# Patient Record
Sex: Female | Born: 1965 | Race: White | Hispanic: No | Marital: Single | Smoking: Current every day smoker
Health system: Southern US, Community
[De-identification: ages and names within clinical notes are randomized; demographics above are authoritative.]

## PROBLEM LIST (undated history)

## (undated) HISTORY — PX: TUBAL LIGATION: SHX77

---

## 2017-07-16 ENCOUNTER — Emergency Department (HOSPITAL_BASED_OUTPATIENT_CLINIC_OR_DEPARTMENT_OTHER): Payer: Self-pay

## 2017-07-16 ENCOUNTER — Other Ambulatory Visit: Payer: Self-pay

## 2017-07-16 ENCOUNTER — Encounter (HOSPITAL_BASED_OUTPATIENT_CLINIC_OR_DEPARTMENT_OTHER): Payer: Self-pay | Admitting: *Deleted

## 2017-07-16 ENCOUNTER — Emergency Department (HOSPITAL_BASED_OUTPATIENT_CLINIC_OR_DEPARTMENT_OTHER)
Admission: EM | Admit: 2017-07-16 | Discharge: 2017-07-16 | Disposition: A | Discharge status: Home / Self Care | Payer: Self-pay | Attending: Emergency Medicine | Admitting: Emergency Medicine

## 2017-07-16 DIAGNOSIS — F17200 Nicotine dependence, unspecified, uncomplicated: Secondary | ICD-10-CM | POA: Insufficient documentation

## 2017-07-16 DIAGNOSIS — J189 Pneumonia, unspecified organism: Secondary | ICD-10-CM | POA: Insufficient documentation

## 2017-07-16 LAB — CBC WITH DIFFERENTIAL/PLATELET
Basophils Absolute: 0 10*3/uL (ref 0.0–0.1)
Basophils Relative: 0 %
Eosinophils Absolute: 0.6 10*3/uL (ref 0.0–0.7)
Eosinophils Relative: 5 %
HCT: 37.4 % (ref 36.0–46.0)
Hemoglobin: 11.9 g/dL — ABNORMAL LOW (ref 12.0–15.0)
Lymphocytes Relative: 28 %
Lymphs Abs: 3.2 10*3/uL (ref 0.7–4.0)
MCH: 27.4 pg (ref 26.0–34.0)
MCHC: 31.8 g/dL (ref 30.0–36.0)
MCV: 86 fL (ref 78.0–100.0)
Monocytes Absolute: 0.8 10*3/uL (ref 0.1–1.0)
Monocytes Relative: 7 %
Neutro Abs: 6.7 10*3/uL (ref 1.7–7.7)
Neutrophils Relative %: 60 %
Platelets: 410 10*3/uL — ABNORMAL HIGH (ref 150–400)
RBC: 4.35 MIL/uL (ref 3.87–5.11)
RDW: 14 % (ref 11.5–15.5)
WBC: 11.3 10*3/uL — ABNORMAL HIGH (ref 4.0–10.5)

## 2017-07-16 LAB — BASIC METABOLIC PANEL
Anion gap: 11 (ref 5–15)
BUN: 17 mg/dL (ref 6–20)
CO2: 28 mmol/L (ref 22–32)
Calcium: 8.9 mg/dL (ref 8.9–10.3)
Chloride: 97 mmol/L — ABNORMAL LOW (ref 101–111)
Creatinine, Ser: 0.69 mg/dL (ref 0.44–1.00)
GFR calc Af Amer: >60 mL/min (ref >60)
GFR calc non Af Amer: >60 mL/min (ref >60)
Glucose, Bld: 102 mg/dL — ABNORMAL HIGH (ref 65–99)
Potassium: 3.3 mmol/L — ABNORMAL LOW (ref 3.5–5.1)
Sodium: 136 mmol/L (ref 135–145)

## 2017-07-16 LAB — TROPONIN I: Troponin I: <0.03 ng/mL (ref <0.03)

## 2017-07-16 MED ORDER — ONDANSETRON HCL 4 MG/2ML IJ SOLN
4.0000 mg | Freq: Once | INTRAMUSCULAR | Status: AC
  Administered 2017-07-16: 4 mg via INTRAVENOUS
  Filled 2017-07-16: qty 2

## 2017-07-16 MED ORDER — POTASSIUM CHLORIDE CRYS ER 20 MEQ PO TBCR
40.0000 meq | EXTENDED_RELEASE_TABLET | Freq: Once | ORAL | Status: AC
  Administered 2017-07-16: 40 meq via ORAL
  Filled 2017-07-16: qty 2

## 2017-07-16 MED ORDER — IPRATROPIUM-ALBUTEROL 0.5-2.5 (3) MG/3ML IN SOLN
3.0000 mL | Freq: Once | RESPIRATORY_TRACT | Status: AC
  Administered 2017-07-16: 3 mL via RESPIRATORY_TRACT
  Filled 2017-07-16: qty 3

## 2017-07-16 MED ORDER — AMOXICILLIN-POT CLAVULANATE 875-125 MG PO TABS: 1.0000 | ORAL_TABLET | Freq: Two times a day (BID) | ORAL | 0 refills | Status: AC

## 2017-07-16 MED ORDER — AMOXICILLIN-POT CLAVULANATE 875-125 MG PO TABS
1.0000 | ORAL_TABLET | Freq: Once | ORAL | Status: AC
  Administered 2017-07-16: 1 via ORAL
  Filled 2017-07-16: qty 1

## 2017-07-16 MED ORDER — AZITHROMYCIN 250 MG PO TABS: 250.0000 mg | ORAL_TABLET | Freq: Every day | ORAL | 0 refills | Status: AC

## 2017-07-16 NOTE — ED Notes (Signed)
Ambulate pt on pulse Ox, pt ranged 92%-96%

## 2017-07-16 NOTE — ED Triage Notes (Signed)
Cough x 3 weeks. Worse at night. She feels like she can't breathe when she starts coughing and can't stop.

## 2017-07-16 NOTE — Discharge Instructions (Signed)
Please take all of your antibiotics until finished!   You may develop abdominal discomfort or diarrhea from the antibiotic.  You may help offset this with probiotics which you can buy or get in yogurt. Do not eat  or take the probiotics until 2 hours after your antibiotic.   Drink plenty of fluids and get plenty of rest.  Alternate 600 mg of ibuprofen and [phone] mg of Tylenol every 3 hours as needed for pain. Do not exceed 4000 mg of Tylenol daily.  Follow-up with a primary care physician for reevaluation of your symptoms.  You may call [facility] tomorrow and tell them that you were referred from the emergency department to establish care with them.  Return to the emergency department if any concerning signs or symptoms develop such as persisting fever that does not improve with ibuprofen or Tylenol, worsening shortness of breath or chest pain, or coughing up blood.

## 2017-07-16 NOTE — ED Provider Notes (Signed)
[facility] EMERGENCY DEPARTMENT Provider Note   CSN: [id number] Arrival date & time: 07/16/17  1755     History   Chief Complaint Chief Complaint  Patient presents with  . Cough    HPI Natalie Owens is a 52 y.o. female presents today with chief complaint acute onset, progressively worsening shortness of breath and cough for 3 weeks.  She states that cough has been present since Christmas.  Productive of yellow-green sputum.  She endorses progressively worsening shortness of breath, DOE, and orthopnea.  She endorses subjective fevers and chills.  She notes that her throat feels sore and that it is difficult to eat secondary to pain with swallowing and decreased appetite.  She states that she stopped smoking on Christmas due to her symptoms and has not had a cigarette in 3 weeks, but prior to this was smoking nearly 1 pack of cigarettes daily.  Endorses intermittent chest pain, most recently experienced it on Sunday 3 days ago.  Pain was substernal with radiation to the right side and resolved after a few hours.  She states that it helped to put a pillow on her chest.  She cannot describe the pain to me.  She has not been seen by a primary care physician in several years and is unsure what medical problems she may have. She does endorse known sick contacts as her coworkers had similar symptoms.  Denies nausea, vomiting, abdominal pain, or diaphoresis.  She has tried over-the-counter cold medications and Mucinex as well as Delsym without significant relief of her symptoms.  The history is provided by the patient.    History reviewed. No pertinent past medical history.  There are no active problems to display for this patient.   Past Surgical History:  Procedure Laterality Date  . TUBAL LIGATION      OB History    No data available       Home Medications    Prior to Admission medications   Medication Sig Start Date End Date Taking? Authorizing Provider    amoxicillin-clavulanate (AUGMENTIN) 875-125 MG tablet Take 1 tablet by mouth every 12 (twelve) hours for 10 days. 07/16/17 07/26/17  Michela Pitcher A, PA-C  azithromycin (ZITHROMAX) 250 MG tablet Take 1 tablet (250 mg total) by mouth daily. Take first 2 tablets together, then 1 every day until finished. 07/16/17   Jeanie Sewer, PA-C    Family History No family history on file.  Social History Social History   Tobacco Use  . Smoking status: Current Every Day Smoker  . Smokeless tobacco: Never Used  Substance Use Topics  . Alcohol use: No    Frequency: Never  . Drug use: No     Allergies   Patient has no allergy information on record.   Review of Systems Review of Systems  Constitutional: Positive for chills and fever.  HENT: Positive for sore throat.   Respiratory: Positive for cough and shortness of breath.   Cardiovascular: Positive for chest pain. Negative for palpitations and leg swelling.  Gastrointestinal: Negative for abdominal pain, nausea and vomiting.  All other systems reviewed and are negative.    Physical Exam Updated Vital Signs BP 120/84   Pulse 88   Temp 98.1 F (36.7 C) (Oral)   Resp 20   Ht 5\' 3"  (1.6 m)   SpO2 92%   Physical Exam  Constitutional: She appears well-developed and well-nourished. No distress.  HENT:  Head: Normocephalic and atraumatic.  Right Ear: External ear normal.  Left  Ear: External ear normal.  Mouth/Throat: Oropharynx is clear and moist.  Posterior oropharynx with mild erythema but no tonsillar hypertrophy, uvular deviation, exudates, or trismus.  No frontal or maxillary sinus tenderness.  Nasal septum is midline without mucosal edema.  TMs without erythema or bulging bilaterally  Eyes: Conjunctivae and EOM are normal. Pupils are equal, round, and reactive to light. Right eye exhibits no discharge. Left eye exhibits no discharge.  Neck: Normal range of motion. Neck supple. No JVD present. No tracheal deviation present.   Cardiovascular: Regular rhythm, normal heart sounds and intact distal pulses.  Mildly tachycardic, 2+ radial and DP/PT pulses bl, negative Homan's bl, no LE edema  Pulmonary/Chest: Effort normal. She has rales. She exhibits no tenderness.  Hypoactive breath sounds globally with Rales noted on the right side anteriorly and posteriorly.  Equal rise and fall of chest, speaking in full sentences without difficulty, audible wheezing while speaking to me.  Abdominal: Soft. Bowel sounds are normal. She exhibits no distension. There is no tenderness.  Musculoskeletal: She exhibits no edema.  Neurological: She is alert.  Skin: Skin is warm and dry. No erythema.  Psychiatric: She has a normal mood and affect. Her behavior is normal.  Nursing note and vitals reviewed.    ED Treatments / Results  Labs (all labs ordered are listed, but only abnormal results are displayed) Labs Reviewed  BASIC METABOLIC PANEL - Abnormal; Notable for the following components:      Result Value   Potassium 3.3 (*)    Chloride 97 (*)    Glucose, Bld 102 (*)    All other components within normal limits  CBC WITH DIFFERENTIAL/PLATELET - Abnormal; Notable for the following components:   WBC 11.3 (*)    Hemoglobin 11.9 (*)    Platelets 410 (*)    All other components within normal limits  TROPONIN I    EKG  EKG Interpretation  Date/Time:  Tuesday July 16 2017 20:10:29 EST Ventricular Rate:  83 PR Interval:    QRS Duration: 96 QT Interval:  384 QTC Calculation: 452 R Axis:   76 Text Interpretation:  Sinus rhythm Confirmed by Rolland PorterJames, Mark ([id number]) on 07/16/2017 8:13:36 PM Also confirmed by Rolland PorterJames, Mark ([id number]), editor Barbette Hairassel, Kerry [phone]  on 07/17/2017 7:46:36 AM       Radiology Dg Chest 2 View  Result Date: 07/16/2017 CLINICAL DATA:  Cough and chest congestion x 3 weeks. Rt side pain sudden onset x 2 days ago. HX: Smoker EXAM: CHEST  2 VIEW COMPARISON:  None. FINDINGS: There is consolidation on the  right. On the frontal view, this extends inferior to the right hilum. On the lateral view this projects posterior to the hilum and extends into the right lower lobe and to the posterior right middle lobe. Left lung is clear. Lungs are mildly hyperexpanded. No pleural effusion.  No pneumothorax. Cardiac silhouette is normal in size. No mediastinal masses. No convincing hilar masses. Skeletal structures are unremarkable. IMPRESSION: 1. Patchy consolidation in the right lower and middle lobes consistent with multifocal pneumonia. Electronically Signed   By: Amie Portlandavid  Ormond M.D.   On: 07/16/2017 18:24    Procedures Procedures (including critical care time)  Medications Ordered in ED Medications  ipratropium-albuterol (DUONEB) 0.5-2.5 (3) MG/3ML nebulizer solution 3 mL (3 mLs Nebulization Given 07/16/17 2041)  amoxicillin-clavulanate (AUGMENTIN) 875-125 MG per tablet 1 tablet (1 tablet Oral Given 07/16/17 2155)  potassium chloride SA (K-DUR,KLOR-CON) CR tablet 40 mEq (40 mEq Oral Given 07/16/17 2155)  ondansetron (  ZOFRAN) injection 4 mg (4 mg Intravenous Given 07/16/17 2150)     Initial Impression / Assessment and Plan / ED Course  I have reviewed the triage vital signs and the nursing notes.  Pertinent labs & imaging results that were available during my care of the patient were reviewed by me and considered in my medical decision making (see chart for details).     Patient with productive cough for 3 weeks.  Afebrile, SPO2 saturations between 90-95% on room air.  Patient speaking in full sentences without difficulty.  She complains of a mild sore throat additionally but presentation is not concerning for strep pharyngitis or PTA.  No evidence of airway compromise and she is tolerating secretions well.  Doubt PE in this patient.  Troponin is negative and EKG shows normal sinus rhythm, doubt ACS or MI.  Chest x-ray shows multifocal pneumonia.  Remainder of lab work is reassuring.  She was ambulated with  pulse ox with stable oxygen saturations.  She improved symptomatically after administration of breathing treatment and on re-auscultation of the lungs airway movement has improved.  She is nontoxic in appearance.  CURB-65 score is 0 and PSI/port score places patient in the low risk category recommending outpatient treatment of community-acquired pneumonia.  No evidence of pericarditis, myocarditis, aortic dissection, or bronchitis.  Will discharge with antibiotics.  Recommend follow-up with a primary care physician for reevaluation of her symptoms.  Encouraged patient to continue with her smoking cessation.  Discussed indications for return to the ED.  Patient and patient's daughter verbalized understanding of and agreement with plan and patient is stable for discharge home at this time.  Final Clinical Impressions(s) / ED Diagnoses   Final diagnoses:  Multifocal pneumonia    ED Discharge Orders        Ordered    amoxicillin-clavulanate (AUGMENTIN) 875-125 MG tablet  Every 12 hours     07/16/17 2151    azithromycin (ZITHROMAX) 250 MG tablet  Daily     07/16/17 2151       Bennye Alm 07/17/17 2303    Rolland Porter, MD 07/20/17 1649

## 2017-07-16 NOTE — ED Notes (Signed)
C/o cough congestion x 3 weeks w cp at times

## 2017-08-06 ENCOUNTER — Inpatient Hospital Stay: Payer: Self-pay

## 2018-05-25 IMAGING — CR DG CHEST 2V
2 series · 2 of 2 positions shown · non-contrast
Comparison: None.

CLINICAL DATA: Cough and chest congestion x 3 weeks. Rt side pain
sudden onset x 2 days ago. HX: Smoker

EXAM:
CHEST  2 VIEW

[w chest pa]
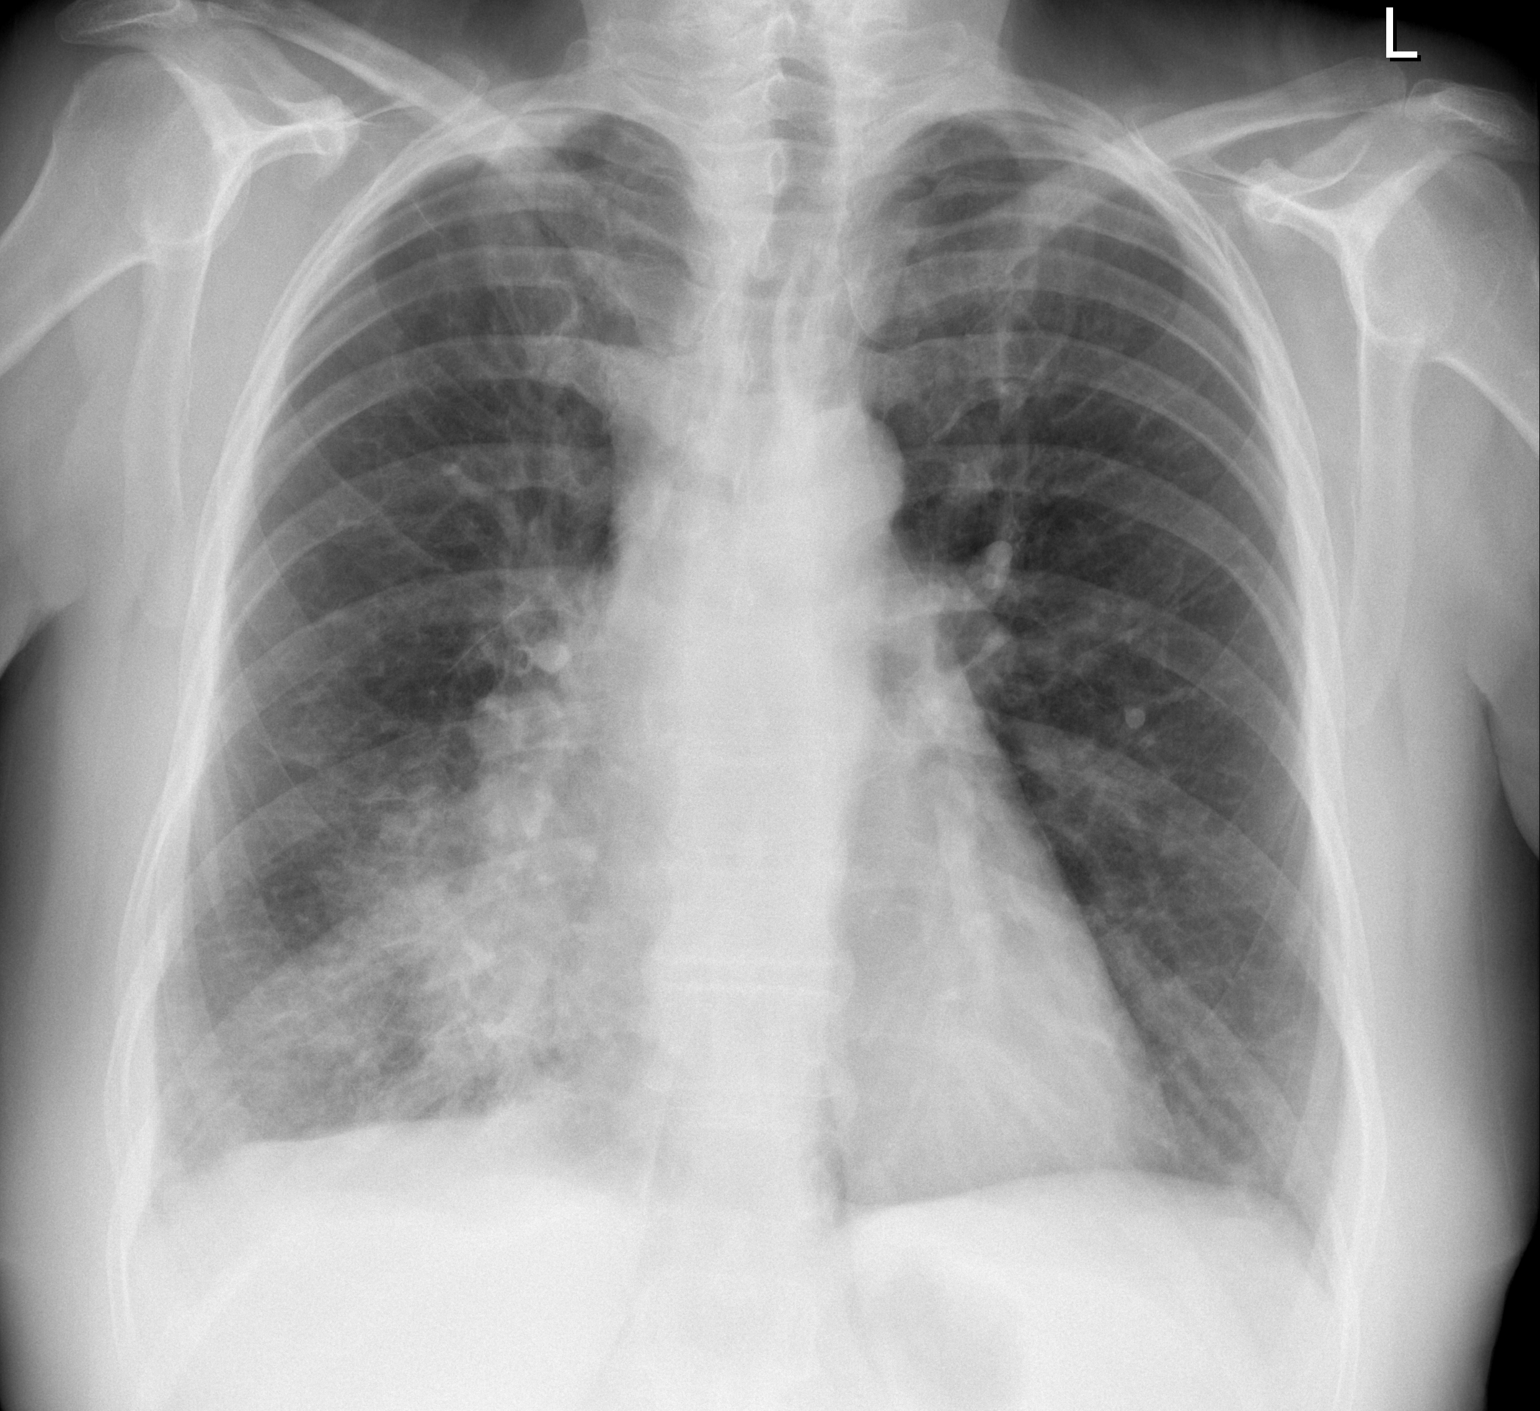

[w chest lat]
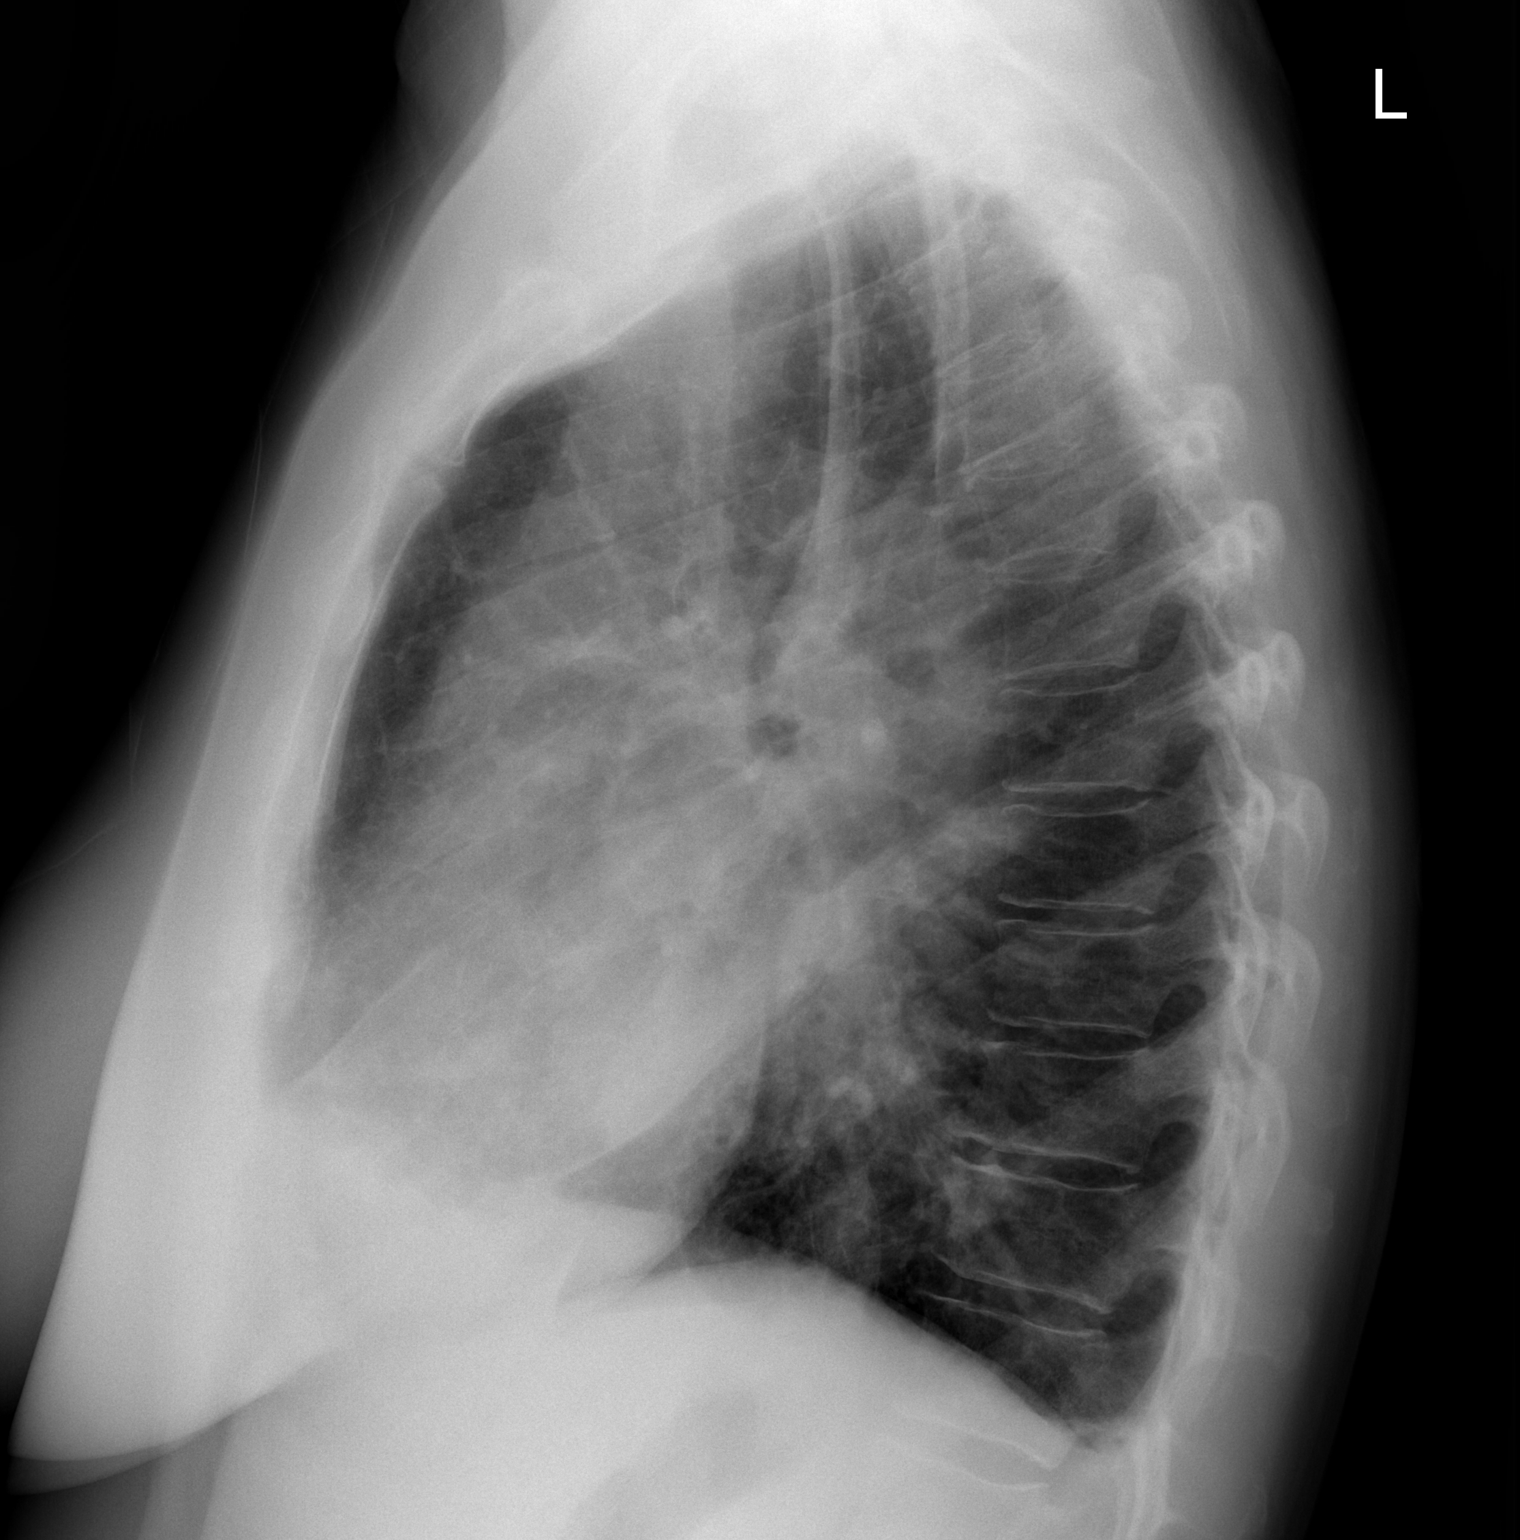

[2 of 2 positions shown; findings below may reference images not displayed]

FINDINGS: There is consolidation on the right. On the frontal view, this
extends inferior to the right hilum. On the lateral view this
projects posterior to the hilum and extends into the right lower
lobe and to the posterior right middle lobe. Left lung is clear.
Lungs are mildly hyperexpanded.

No pleural effusion.  No pneumothorax.

Cardiac silhouette is normal in size. No mediastinal masses. No
convincing hilar masses.

Skeletal structures are unremarkable.
IMPRESSION: 1. Patchy consolidation in the right lower and middle lobes
consistent with multifocal pneumonia.

## 2022-09-24 DIAGNOSIS — C3490 Malignant neoplasm of unspecified part of unspecified bronchus or lung: Secondary | ICD-10-CM | POA: Diagnosis not present

## 2022-09-24 DIAGNOSIS — K760 Fatty (change of) liver, not elsewhere classified: Secondary | ICD-10-CM | POA: Diagnosis not present

## 2022-09-24 DIAGNOSIS — C3492 Malignant neoplasm of unspecified part of left bronchus or lung: Secondary | ICD-10-CM | POA: Diagnosis not present

## 2022-09-24 DIAGNOSIS — J439 Emphysema, unspecified: Secondary | ICD-10-CM | POA: Diagnosis not present

## 2022-09-24 DIAGNOSIS — Z1231 Encounter for screening mammogram for malignant neoplasm of breast: Secondary | ICD-10-CM | POA: Diagnosis not present

## 2022-09-26 DIAGNOSIS — C3490 Malignant neoplasm of unspecified part of unspecified bronchus or lung: Secondary | ICD-10-CM | POA: Diagnosis not present

## 2023-02-04 DIAGNOSIS — M545 Low back pain, unspecified: Secondary | ICD-10-CM | POA: Diagnosis not present

## 2023-02-04 DIAGNOSIS — G8929 Other chronic pain: Secondary | ICD-10-CM | POA: Diagnosis not present

## 2023-02-04 DIAGNOSIS — R000 Tachycardia, unspecified: Secondary | ICD-10-CM | POA: Diagnosis not present

## 2023-02-04 DIAGNOSIS — Z85118 Personal history of other malignant neoplasm of bronchus and lung: Secondary | ICD-10-CM | POA: Diagnosis not present

## 2023-02-04 DIAGNOSIS — G4700 Insomnia, unspecified: Secondary | ICD-10-CM | POA: Diagnosis not present

## 2023-02-04 DIAGNOSIS — J449 Chronic obstructive pulmonary disease, unspecified: Secondary | ICD-10-CM | POA: Diagnosis not present

## 2023-02-04 DIAGNOSIS — F17210 Nicotine dependence, cigarettes, uncomplicated: Secondary | ICD-10-CM | POA: Diagnosis not present

## 2023-03-26 DIAGNOSIS — J701 Chronic and other pulmonary manifestations due to radiation: Secondary | ICD-10-CM | POA: Diagnosis not present

## 2023-03-26 DIAGNOSIS — K449 Diaphragmatic hernia without obstruction or gangrene: Secondary | ICD-10-CM | POA: Diagnosis not present

## 2023-03-26 DIAGNOSIS — C3490 Malignant neoplasm of unspecified part of unspecified bronchus or lung: Secondary | ICD-10-CM | POA: Diagnosis not present

## 2023-03-26 DIAGNOSIS — I6782 Cerebral ischemia: Secondary | ICD-10-CM | POA: Diagnosis not present

## 2023-03-26 DIAGNOSIS — J439 Emphysema, unspecified: Secondary | ICD-10-CM | POA: Diagnosis not present

## 2023-03-26 DIAGNOSIS — D487 Neoplasm of uncertain behavior of other specified sites: Secondary | ICD-10-CM | POA: Diagnosis not present

## 2023-03-27 DIAGNOSIS — C3490 Malignant neoplasm of unspecified part of unspecified bronchus or lung: Secondary | ICD-10-CM | POA: Diagnosis not present

## 2023-03-27 DIAGNOSIS — Z08 Encounter for follow-up examination after completed treatment for malignant neoplasm: Secondary | ICD-10-CM | POA: Diagnosis not present

## 2023-03-27 DIAGNOSIS — Z85118 Personal history of other malignant neoplasm of bronchus and lung: Secondary | ICD-10-CM | POA: Diagnosis not present

## 2023-09-03 DIAGNOSIS — Z85118 Personal history of other malignant neoplasm of bronchus and lung: Secondary | ICD-10-CM | POA: Diagnosis not present

## 2023-09-03 DIAGNOSIS — M545 Low back pain, unspecified: Secondary | ICD-10-CM | POA: Diagnosis not present

## 2023-09-03 DIAGNOSIS — G8929 Other chronic pain: Secondary | ICD-10-CM | POA: Diagnosis not present

## 2023-09-03 DIAGNOSIS — F17210 Nicotine dependence, cigarettes, uncomplicated: Secondary | ICD-10-CM | POA: Diagnosis not present

## 2023-09-03 DIAGNOSIS — J449 Chronic obstructive pulmonary disease, unspecified: Secondary | ICD-10-CM | POA: Diagnosis not present

## 2023-09-03 DIAGNOSIS — Z23 Encounter for immunization: Secondary | ICD-10-CM | POA: Diagnosis not present

## 2023-09-03 DIAGNOSIS — Z131 Encounter for screening for diabetes mellitus: Secondary | ICD-10-CM | POA: Diagnosis not present

## 2023-09-03 DIAGNOSIS — E781 Pure hyperglyceridemia: Secondary | ICD-10-CM | POA: Diagnosis not present

## 2023-09-03 DIAGNOSIS — R000 Tachycardia, unspecified: Secondary | ICD-10-CM | POA: Diagnosis not present

## 2023-09-03 DIAGNOSIS — G4700 Insomnia, unspecified: Secondary | ICD-10-CM | POA: Diagnosis not present

## 2023-09-04 DIAGNOSIS — I700 Atherosclerosis of aorta: Secondary | ICD-10-CM | POA: Diagnosis not present

## 2023-09-04 DIAGNOSIS — C3490 Malignant neoplasm of unspecified part of unspecified bronchus or lung: Secondary | ICD-10-CM | POA: Diagnosis not present

## 2023-09-04 DIAGNOSIS — R9082 White matter disease, unspecified: Secondary | ICD-10-CM | POA: Diagnosis not present

## 2023-09-04 DIAGNOSIS — Z85118 Personal history of other malignant neoplasm of bronchus and lung: Secondary | ICD-10-CM | POA: Diagnosis not present

## 2023-09-04 DIAGNOSIS — C3491 Malignant neoplasm of unspecified part of right bronchus or lung: Secondary | ICD-10-CM | POA: Diagnosis not present

## 2023-09-04 DIAGNOSIS — K449 Diaphragmatic hernia without obstruction or gangrene: Secondary | ICD-10-CM | POA: Diagnosis not present

## 2023-09-04 DIAGNOSIS — I2510 Atherosclerotic heart disease of native coronary artery without angina pectoris: Secondary | ICD-10-CM | POA: Diagnosis not present

## 2023-09-18 DIAGNOSIS — C3490 Malignant neoplasm of unspecified part of unspecified bronchus or lung: Secondary | ICD-10-CM | POA: Diagnosis not present

## 2023-11-08 DIAGNOSIS — L0390 Cellulitis, unspecified: Secondary | ICD-10-CM | POA: Diagnosis not present

## 2023-11-12 DIAGNOSIS — B379 Candidiasis, unspecified: Secondary | ICD-10-CM | POA: Diagnosis not present

## 2023-11-12 DIAGNOSIS — L0390 Cellulitis, unspecified: Secondary | ICD-10-CM | POA: Diagnosis not present

## 2023-11-12 DIAGNOSIS — T3695XA Adverse effect of unspecified systemic antibiotic, initial encounter: Secondary | ICD-10-CM | POA: Diagnosis not present

## 2023-11-14 DIAGNOSIS — Z1231 Encounter for screening mammogram for malignant neoplasm of breast: Secondary | ICD-10-CM | POA: Diagnosis not present

## 2023-11-18 DIAGNOSIS — R7303 Prediabetes: Secondary | ICD-10-CM | POA: Diagnosis not present

## 2023-11-18 DIAGNOSIS — L0390 Cellulitis, unspecified: Secondary | ICD-10-CM | POA: Diagnosis not present

## 2023-11-26 DIAGNOSIS — L0390 Cellulitis, unspecified: Secondary | ICD-10-CM | POA: Diagnosis not present

## 2023-12-04 DIAGNOSIS — L02416 Cutaneous abscess of left lower limb: Secondary | ICD-10-CM | POA: Diagnosis not present

## 2024-02-18 DIAGNOSIS — Z8614 Personal history of Methicillin resistant Staphylococcus aureus infection: Secondary | ICD-10-CM | POA: Diagnosis not present

## 2024-02-18 DIAGNOSIS — L0231 Cutaneous abscess of buttock: Secondary | ICD-10-CM | POA: Diagnosis not present

## 2024-02-18 DIAGNOSIS — L02416 Cutaneous abscess of left lower limb: Secondary | ICD-10-CM | POA: Diagnosis not present

## 2024-02-18 DIAGNOSIS — L0390 Cellulitis, unspecified: Secondary | ICD-10-CM | POA: Diagnosis not present

## 2024-03-04 DIAGNOSIS — C3411 Malignant neoplasm of upper lobe, right bronchus or lung: Secondary | ICD-10-CM | POA: Diagnosis not present

## 2024-03-04 DIAGNOSIS — C7931 Secondary malignant neoplasm of brain: Secondary | ICD-10-CM | POA: Diagnosis not present

## 2024-03-04 DIAGNOSIS — G9389 Other specified disorders of brain: Secondary | ICD-10-CM | POA: Diagnosis not present

## 2024-03-09 DIAGNOSIS — G8929 Other chronic pain: Secondary | ICD-10-CM | POA: Diagnosis not present

## 2024-03-09 DIAGNOSIS — J449 Chronic obstructive pulmonary disease, unspecified: Secondary | ICD-10-CM | POA: Diagnosis not present

## 2024-03-09 DIAGNOSIS — G4700 Insomnia, unspecified: Secondary | ICD-10-CM | POA: Diagnosis not present

## 2024-03-09 DIAGNOSIS — Z23 Encounter for immunization: Secondary | ICD-10-CM | POA: Diagnosis not present

## 2024-03-09 DIAGNOSIS — Z85118 Personal history of other malignant neoplasm of bronchus and lung: Secondary | ICD-10-CM | POA: Diagnosis not present

## 2024-03-09 DIAGNOSIS — R000 Tachycardia, unspecified: Secondary | ICD-10-CM | POA: Diagnosis not present

## 2024-03-09 DIAGNOSIS — M545 Low back pain, unspecified: Secondary | ICD-10-CM | POA: Diagnosis not present

## 2024-03-09 DIAGNOSIS — E781 Pure hyperglyceridemia: Secondary | ICD-10-CM | POA: Diagnosis not present

## 2024-03-09 DIAGNOSIS — R7303 Prediabetes: Secondary | ICD-10-CM | POA: Diagnosis not present

## 2024-03-09 DIAGNOSIS — F17210 Nicotine dependence, cigarettes, uncomplicated: Secondary | ICD-10-CM | POA: Diagnosis not present
